# Patient Record
Sex: Male | Born: 1990 | Race: Black or African American | Hispanic: No | Marital: Single
Health system: Southern US, Community
[De-identification: ages and names within clinical notes are randomized; demographics above are authoritative.]

---

## 2020-06-07 ENCOUNTER — Emergency Department (HOSPITAL_COMMUNITY): Payer: Self-pay

## 2020-06-07 ENCOUNTER — Emergency Department (HOSPITAL_COMMUNITY)
Admission: EM | Admit: 2020-06-07 | Discharge: 2020-06-07 | Disposition: A | Discharge status: Home / Self Care | Payer: Self-pay | Attending: Emergency Medicine | Admitting: Emergency Medicine

## 2020-06-07 DIAGNOSIS — Z23 Encounter for immunization: Secondary | ICD-10-CM | POA: Insufficient documentation

## 2020-06-07 DIAGNOSIS — W3400XA Accidental discharge from unspecified firearms or gun, initial encounter: Secondary | ICD-10-CM | POA: Insufficient documentation

## 2020-06-07 DIAGNOSIS — S71101A Unspecified open wound, right thigh, initial encounter: Secondary | ICD-10-CM | POA: Insufficient documentation

## 2020-06-07 MED ORDER — TETANUS-DIPHTH-ACELL PERTUSSIS 5-2.5-18.5 LF-MCG/0.5 IM SUSY
0.5000 mL | PREFILLED_SYRINGE | Freq: Once | INTRAMUSCULAR | Status: AC
  Administered 2020-06-07: 0.5 mL via INTRAMUSCULAR
  Filled 2020-06-07: qty 0.5
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
